# Patient Record
Sex: Female | Born: 1960 | Race: White | Hispanic: No | Marital: Married | Smoking: Never smoker
Health system: Southern US, Community
[De-identification: ages and names within clinical notes are randomized; demographics above are authoritative.]

## PROBLEM LIST (undated history)

## (undated) DIAGNOSIS — K219 Gastro-esophageal reflux disease without esophagitis: Secondary | ICD-10-CM

## (undated) DIAGNOSIS — I639 Cerebral infarction, unspecified: Secondary | ICD-10-CM

## (undated) DIAGNOSIS — E079 Disorder of thyroid, unspecified: Secondary | ICD-10-CM

## (undated) DIAGNOSIS — F32A Depression, unspecified: Secondary | ICD-10-CM

## (undated) DIAGNOSIS — C801 Malignant (primary) neoplasm, unspecified: Secondary | ICD-10-CM

## (undated) HISTORY — PX: SPLENECTOMY, TOTAL: SHX788

## (undated) HISTORY — PX: BACK SURGERY: SHX140

---

## 2021-03-17 ENCOUNTER — Encounter (HOSPITAL_BASED_OUTPATIENT_CLINIC_OR_DEPARTMENT_OTHER): Payer: Self-pay | Admitting: *Deleted

## 2021-03-17 ENCOUNTER — Other Ambulatory Visit: Payer: Self-pay

## 2021-03-17 ENCOUNTER — Emergency Department (HOSPITAL_BASED_OUTPATIENT_CLINIC_OR_DEPARTMENT_OTHER)
Admission: EM | Admit: 2021-03-17 | Discharge: 2021-03-17 | Disposition: A | Discharge status: Home / Self Care | Payer: BC Managed Care – PPO | Attending: Emergency Medicine | Admitting: Emergency Medicine

## 2021-03-17 ENCOUNTER — Emergency Department (HOSPITAL_BASED_OUTPATIENT_CLINIC_OR_DEPARTMENT_OTHER): Payer: BC Managed Care – PPO

## 2021-03-17 DIAGNOSIS — Z8673 Personal history of transient ischemic attack (TIA), and cerebral infarction without residual deficits: Secondary | ICD-10-CM | POA: Insufficient documentation

## 2021-03-17 DIAGNOSIS — Z79899 Other long term (current) drug therapy: Secondary | ICD-10-CM | POA: Insufficient documentation

## 2021-03-17 DIAGNOSIS — S0003XA Contusion of scalp, initial encounter: Secondary | ICD-10-CM | POA: Insufficient documentation

## 2021-03-17 DIAGNOSIS — W228XXA Striking against or struck by other objects, initial encounter: Secondary | ICD-10-CM | POA: Insufficient documentation

## 2021-03-17 DIAGNOSIS — Z7902 Long term (current) use of antithrombotics/antiplatelets: Secondary | ICD-10-CM | POA: Diagnosis not present

## 2021-03-17 DIAGNOSIS — T148XXA Other injury of unspecified body region, initial encounter: Secondary | ICD-10-CM

## 2021-03-17 DIAGNOSIS — Z859 Personal history of malignant neoplasm, unspecified: Secondary | ICD-10-CM | POA: Diagnosis not present

## 2021-03-17 DIAGNOSIS — S8992XA Unspecified injury of left lower leg, initial encounter: Secondary | ICD-10-CM | POA: Diagnosis present

## 2021-03-17 HISTORY — DX: Disorder of thyroid, unspecified: E07.9

## 2021-03-17 HISTORY — DX: Depression, unspecified: F32.A

## 2021-03-17 HISTORY — DX: Gastro-esophageal reflux disease without esophagitis: K21.9

## 2021-03-17 HISTORY — DX: Malignant (primary) neoplasm, unspecified: C80.1

## 2021-03-17 HISTORY — DX: Cerebral infarction, unspecified: I63.9

## 2021-03-17 NOTE — ED Notes (Signed)
ED Provider at bedside. 

## 2021-03-17 NOTE — ED Provider Notes (Signed)
[name] Provider Note   CSN: [id number] Arrival date & time: 03/17/21  1401     History Head injury   Cassandra Mclaughlin is a 60 y.o. female with history significant for CVA on Plavix who presents for evaluation of head injury.  Stood up and hit posterior aspect of head on cabinet door.  She developed hematoma.  Has had a mild headache since.  She denies LOC.  No vision changes.  No neck stiffness neck rigidity.  No paresthesias, weakness, facial droop chest pain shortness of breath or emesis.  Has not taken anything for symptoms.  Rates her current pain a 4/10.  She does not anything for symptoms at this time.  Has been ambulatory since.  Denies additional aggravating or alleviating factors.  History obtained  from patient and past medical records.  No interpretor was used.  HPI     Past Medical History:  Diagnosis Date  . Cancer ([place])   . Depression   . GERD (gastroesophageal reflux disease)   . Stroke ([place])   . Thyroid disease     There are no problems to display for this patient.   Past Surgical History:  Procedure Laterality Date  . BACK SURGERY    . SPLENECTOMY, TOTAL       OB History   No obstetric history on file.     No family history on file.  Social History   Tobacco Use  . Smoking status: Never Smoker  . Smokeless tobacco: Never Used  Substance Use Topics  . Alcohol use: Never  . Drug use: Never    Home Medications Prior to Admission medications   Medication Sig Start Date End Date Taking? Authorizing Provider  clopidogrel (PLAVIX) 75 MG tablet Take 75 mg by mouth daily.   Yes [provider]  FLUoxetine HCl (PROZAC PO) Take by mouth.   Yes [provider]  Levothyroxine Sodium (SYNTHROID PO) Take by mouth.   Yes [provider]  Nebivolol HCl (BYSTOLIC PO) Take by mouth.   Yes [provider]  RABEprazole (ACIPHEX) 20 MG tablet Take 20 mg by mouth daily.   Yes [provider]    Allergies    Keflex [cephalexin] and Penicillins  Review of Systems   Review of Systems  Constitutional: Negative.   HENT: Negative.   Respiratory: Negative.   Cardiovascular: Negative.   Gastrointestinal: Negative.   Genitourinary: Negative.   Musculoskeletal: Negative.   Skin: Positive for wound.  Neurological: Positive for headaches. Negative for dizziness, tremors, seizures, syncope, facial asymmetry, weakness, light-headedness and numbness.  All other systems reviewed and are negative.   Physical Exam Updated Vital Signs BP (!) 125/99   Pulse 99   Temp 98.6 F (37 C) (Oral)   Resp 18   Ht 5' (1.524 m)   Wt 61.7 kg   SpO2 100%   BMI 26.56 kg/m   Physical Exam Vitals and nursing note reviewed.  Constitutional:      General: She is not in acute distress.    Appearance: She is well-developed. She is not ill-appearing, toxic-appearing or diaphoretic.  HENT:     Head: Normocephalic.     Comments: Hematoma to posterior right occipital scalp. No overlying lacerations.    Nose: Nose normal. No congestion.     Mouth/Throat:     Mouth: Mucous membranes are moist.  Eyes:     Pupils: Pupils are equal, round, and reactive to light.  Cardiovascular:  Rate and Rhythm: Normal rate.     Pulses: Normal pulses.     Heart sounds: Normal heart sounds.  Pulmonary:     Effort: Pulmonary effort is normal. No respiratory distress.     Breath sounds: Normal breath sounds.  Abdominal:     General: Bowel sounds are normal. There is no distension.  Musculoskeletal:        General: Normal range of motion.     Cervical back: Normal range of motion.  Skin:    General: Skin is warm and dry.     Capillary Refill: Capillary refill takes less than 2 seconds.  Neurological:     General: No focal deficit present.     Mental Status: She is alert and oriented to person, place, and time.     Comments: Cn 2-12 grossly intact. Intact sensation Ambulatory without  difficulty      ED Results / Procedures / Treatments   Labs (all labs ordered are listed, but only abnormal results are displayed) Labs Reviewed - No data to display  EKG None  Radiology CT Head Wo Contrast  Result Date: 03/17/2021 CLINICAL DATA:  Posttraumatic headache after injury. EXAM: CT HEAD WITHOUT CONTRAST TECHNIQUE: Contiguous axial images were obtained from the base of the skull through the vertex without intravenous contrast. COMPARISON:  None. FINDINGS: Brain: No evidence of acute infarction, hemorrhage, hydrocephalus, extra-axial collection or mass lesion/mass effect. Vascular: No hyperdense vessel or unexpected calcification. Skull: Normal. Negative for fracture or focal lesion. Sinuses/Orbits: No acute finding. Other: Small right parietal scalp hematoma is noted. IMPRESSION: Small right parietal scalp hematoma. No acute intracranial abnormality seen. Electronically Signed   By: Marijo Conception M.D.   On: 03/17/2021 15:35    Procedures Procedures   Medications Ordered in ED Medications - No data to display  ED Course  I have reviewed the triage vital signs and the nursing notes.  Pertinent labs & imaging results that were available during my care of the patient were reviewed by me and considered in my medical decision making (see chart for details).   60 year old on Plavix presents here for evaluation of head injury.  He is afebrile, nonseptic, non-ill-appearing.  Stood up and hit posterior right occipital area on cabinet.  Subsequently has hematoma.  She has no overlying laceration or bleeding.  She has a nonfocal neuro exam without deficits.  Will obtain CT imaging head given thinners  CT shows small hematoma  Patient reassessed.  NV intact.  Discussed CT imaging.  DC home symptomatic management.  The patient has been appropriately medically screened and/or stabilized in the ED. I have low suspicion for any other emergent medical condition which would require  further screening, evaluation or treatment in the ED or require inpatient management.  Patient is hemodynamically stable and in no acute distress.  Patient able to ambulate in department prior to ED.  Evaluation does not show acute pathology that would require ongoing or additional emergent interventions while in the emergency department or further inpatient treatment.  I have discussed the diagnosis with the patient and answered all questions.  Pain is been managed while in the emergency department and patient has no further complaints prior to discharge.  Patient is comfortable with plan discussed in room and is stable for discharge at this time.  I have discussed strict return precautions for returning to the emergency department.  Patient was encouraged to follow-up with PCP/specialist refer to at discharge.    MDM Rules/Calculators/A&P  Final Clinical Impression(s) / ED Diagnoses Final diagnoses:  Hematoma    Rx / DC Orders ED Discharge Orders    None       Tynika Luddy A, PA-C 03/17/21 1605    Fredia Sorrow, MD 03/18/21 [phone]

## 2021-03-17 NOTE — ED Triage Notes (Signed)
She was unloading a cabinet and the top fell hitting her in the right side of her head. Large hematoma noted. No loc. Headache. She is taking blood thinners. States she does not feel right.

## 2021-03-17 NOTE — Discharge Instructions (Signed)
It was our pleasure taking care of you here in the emergency department today.  Your CT scan was reassuring and only showed a small hematoma which is a small collection of blood on the outside of your skull.  No evidence of stroke or brain bleed or mass.  Take Tylenol as needed for pain.  I would suggest ice to your scalp over the next 24 hours.  If you have any new or worsening symptoms please seek reevaluation

## 2022-09-08 IMAGING — CT CT HEAD W/O CM
4 series · 17 of 47 positions shown, 19 images · non-contrast
Comparison: None.

CLINICAL DATA: Posttraumatic headache after injury.

EXAM:
CT HEAD WITHOUT CONTRAST
TECHNIQUE: Contiguous axial images were obtained from the base of the skull
through the vertex without intravenous contrast.

[Series 2: head wo · axial · 0.43mm/px · z∈[-156,-46]mm · 7 of 30 slices shown, 9 images]
[im 4/30  brain]
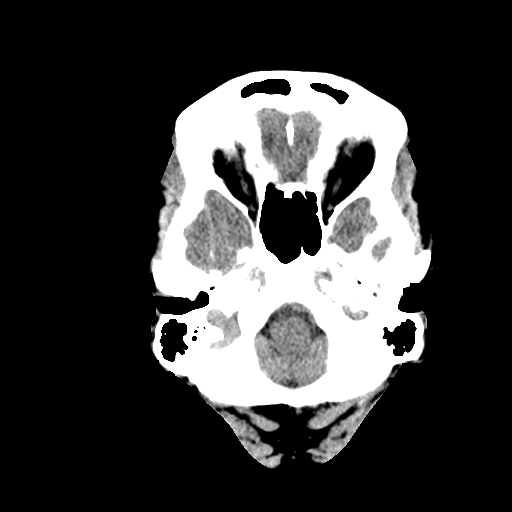
[im 4/30  bone]
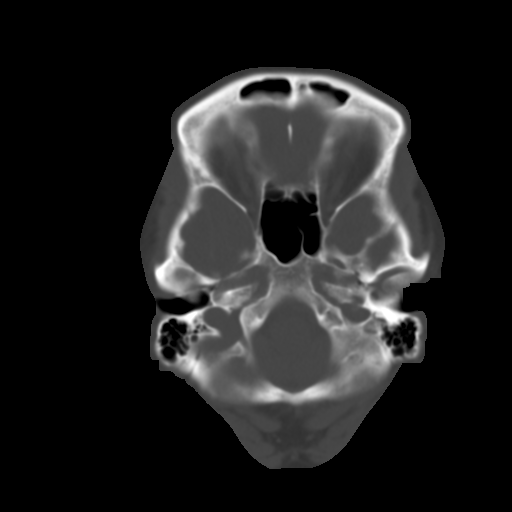
[im 8/30  brain]
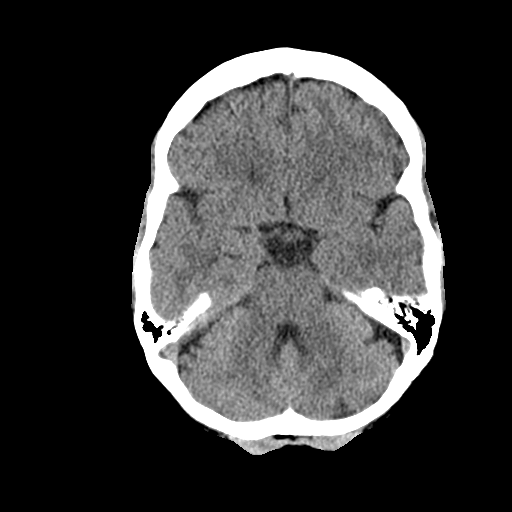
[im 11/30  brain]
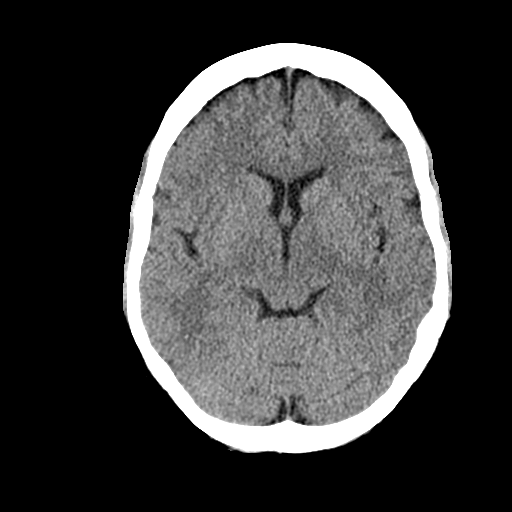
[im 15/30  brain]
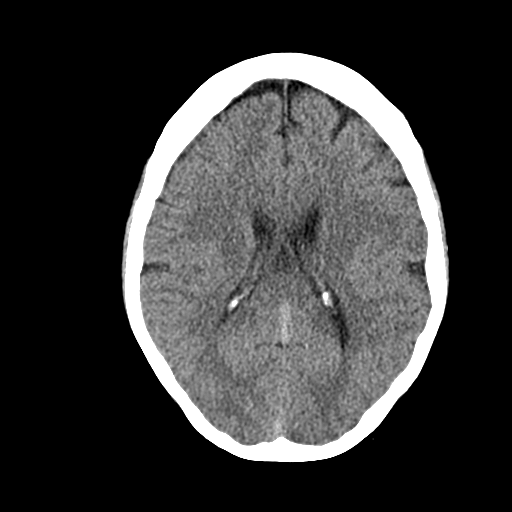
[im 19/30  brain]
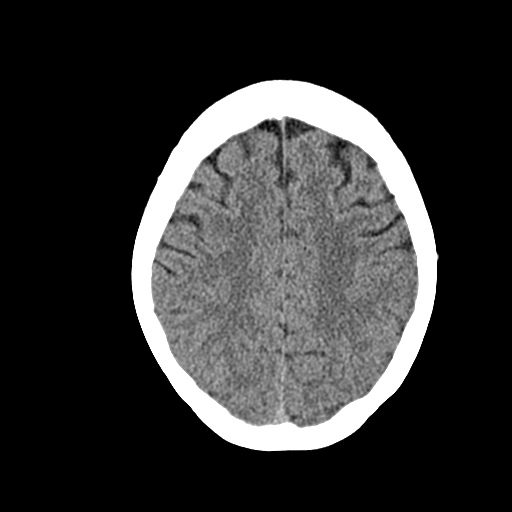
[im 19/30  bone]
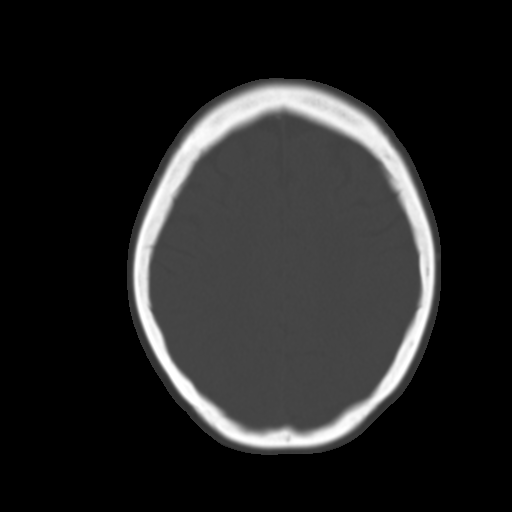
[im 22/30  brain]
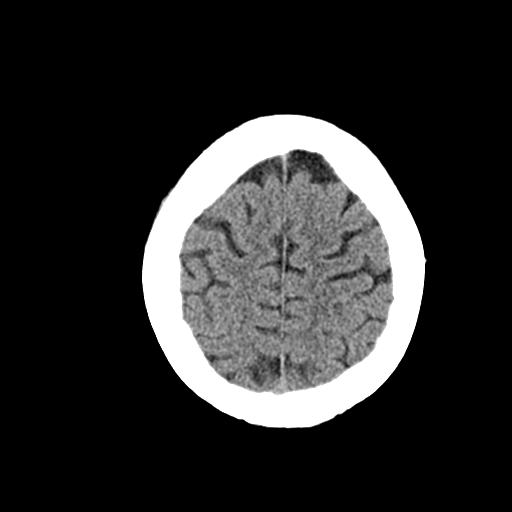
[im 26/30  brain]
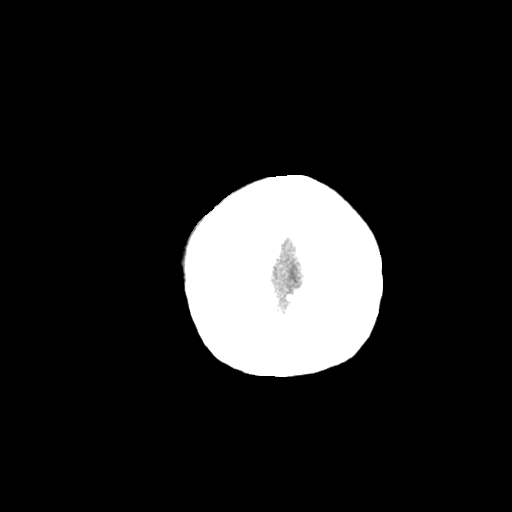

[Series 3: head bone · axial · 0.43mm/px · z∈[-157,-107]mm · 4 of 74 slices shown]
[im 8/74  bone]
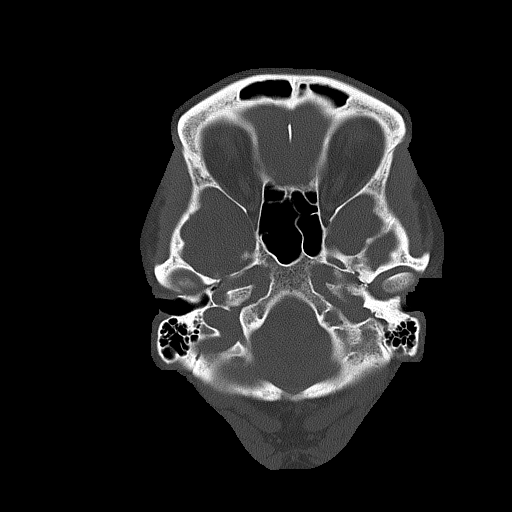
[im 15/74  bone]
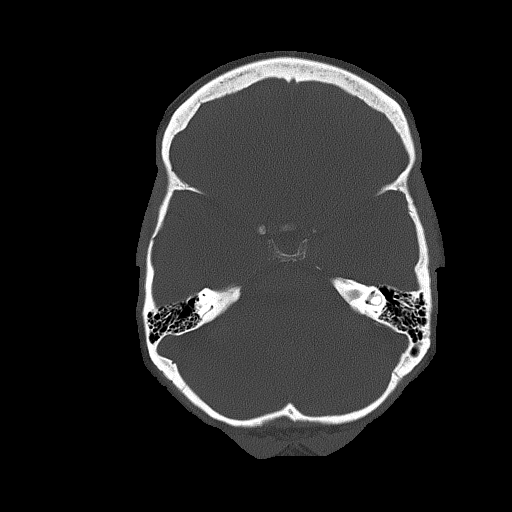
[im 22/74  bone]
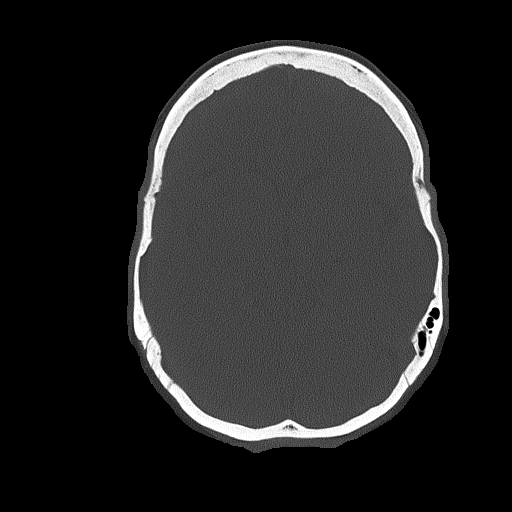
[im 33/74  bone]
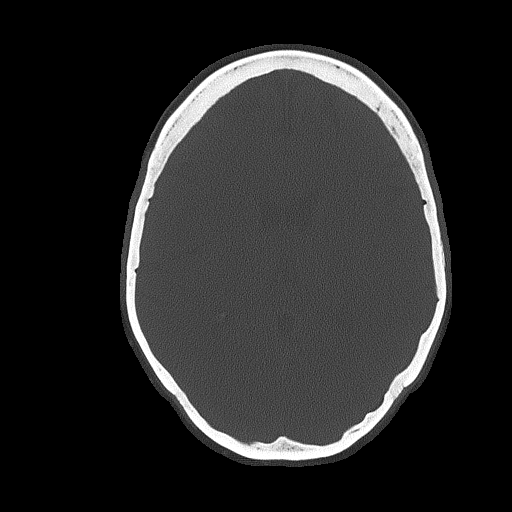

[Series 4: cor soft · coronal · 0.30mm/px · 3 of 64 slices shown]
[im 22/64  brain]
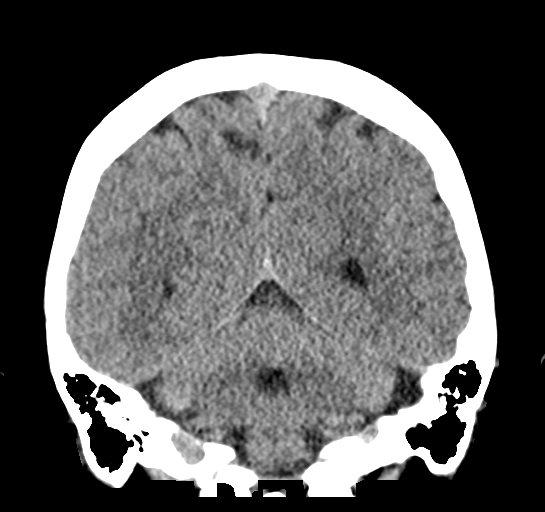
[im 29/64  brain]
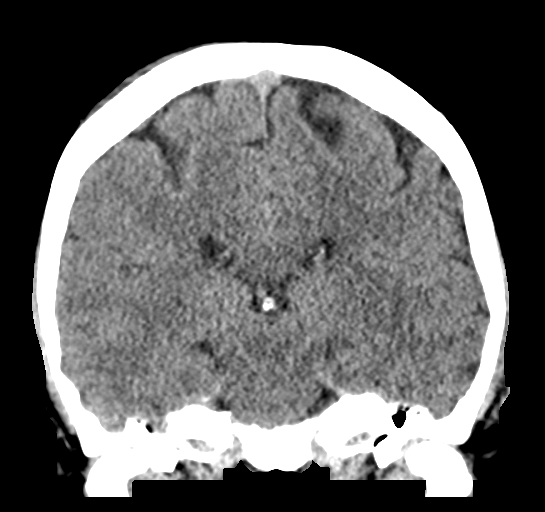
[im 36/64  brain]
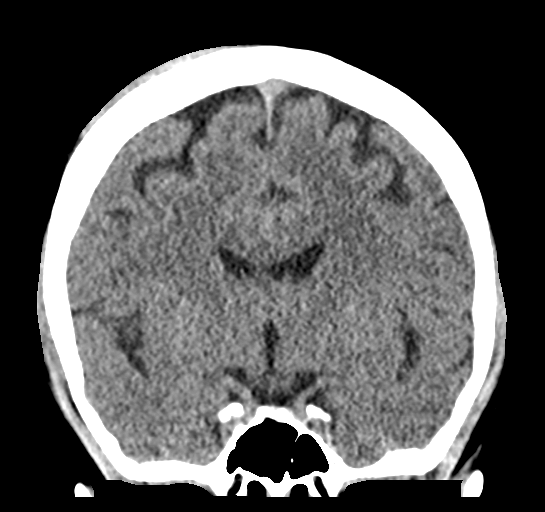

[Series 5: sag soft · sagittal · 0.31mm/px · 3 of 52 slices shown]
[im 18/52  brain]
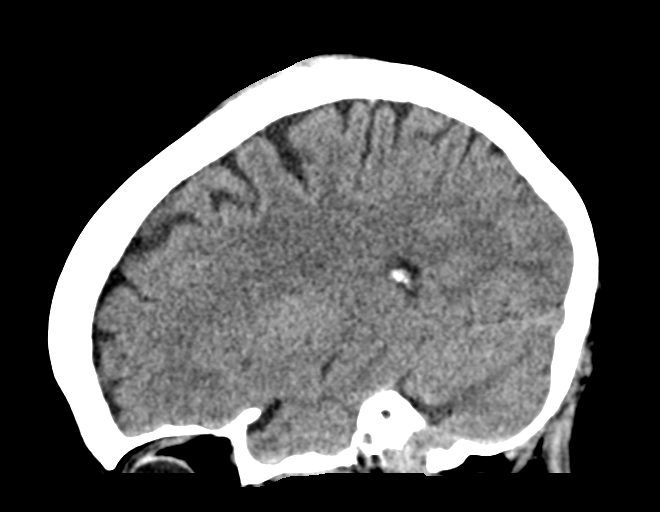
[im 26/52  brain]
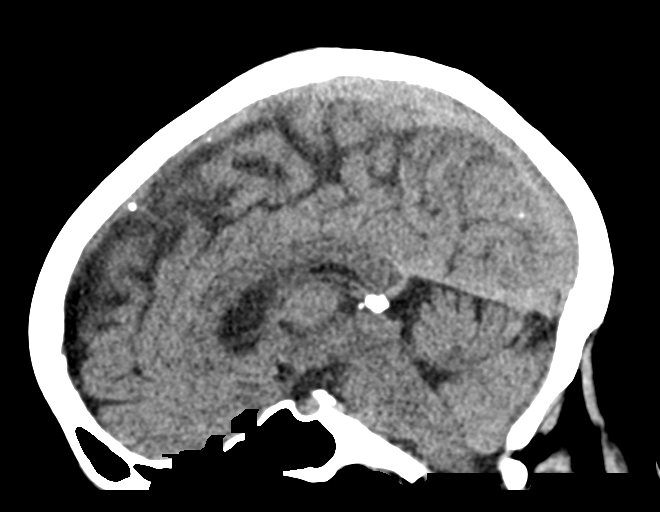
[im 35/52  brain]
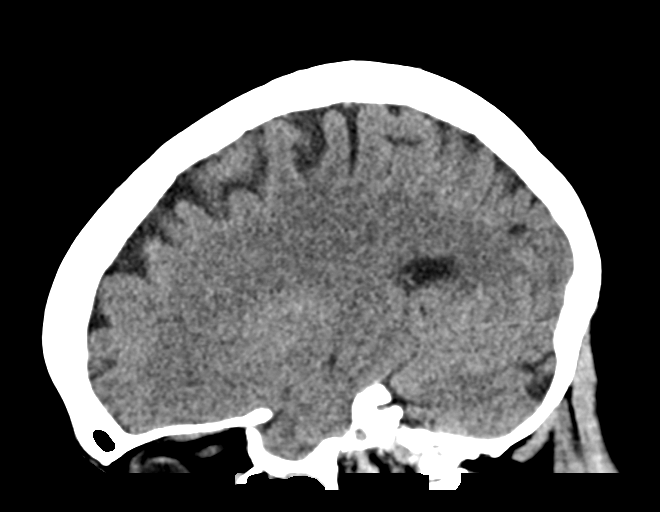

[17 of 47 positions shown; findings below may reference images not displayed]

FINDINGS: Brain: No evidence of acute infarction, hemorrhage, hydrocephalus,
extra-axial collection or mass lesion/mass effect.

Vascular: No hyperdense vessel or unexpected calcification.

Skull: Normal. Negative for fracture or focal lesion.

Sinuses/Orbits: No acute finding.

Other: Small right parietal scalp hematoma is noted.
IMPRESSION: Small right parietal scalp hematoma. No acute intracranial
abnormality seen.
